# Patient Record
Sex: Male | Born: 1990 | Race: Black or African American | Hispanic: No | Marital: Single | Smoking: Never smoker
Health system: Southern US, Community
[De-identification: ages and names within clinical notes are randomized; demographics above are authoritative.]

## PROBLEM LIST (undated history)

## (undated) HISTORY — PX: TONSILLECTOMY: SUR1361

---

## 2013-09-19 ENCOUNTER — Emergency Department (HOSPITAL_COMMUNITY)
Admission: EM | Admit: 2013-09-19 | Discharge: 2013-09-19 | Disposition: A | Discharge status: Home / Self Care | Payer: Self-pay | Attending: Emergency Medicine | Admitting: Emergency Medicine

## 2013-09-19 ENCOUNTER — Emergency Department (HOSPITAL_COMMUNITY): Payer: Self-pay

## 2013-09-19 ENCOUNTER — Encounter (HOSPITAL_COMMUNITY): Payer: Self-pay | Admitting: Emergency Medicine

## 2013-09-19 DIAGNOSIS — W219XXA Striking against or struck by unspecified sports equipment, initial encounter: Secondary | ICD-10-CM | POA: Insufficient documentation

## 2013-09-19 DIAGNOSIS — Y9367 Activity, basketball: Secondary | ICD-10-CM | POA: Insufficient documentation

## 2013-09-19 DIAGNOSIS — S20211A Contusion of right front wall of thorax, initial encounter: Secondary | ICD-10-CM

## 2013-09-19 DIAGNOSIS — IMO0002 Reserved for concepts with insufficient information to code with codable children: Secondary | ICD-10-CM | POA: Insufficient documentation

## 2013-09-19 DIAGNOSIS — Y9239 Other specified sports and athletic area as the place of occurrence of the external cause: Secondary | ICD-10-CM | POA: Insufficient documentation

## 2013-09-19 DIAGNOSIS — S20219A Contusion of unspecified front wall of thorax, initial encounter: Secondary | ICD-10-CM | POA: Insufficient documentation

## 2013-09-19 NOTE — ED Provider Notes (Signed)
CSN: [id number]     Arrival date & time 09/19/13  1821 History  This chart was scribed for non-physician practitioner Marlon Pel, PA-C, working with Roney Marion, MD by Dorothey Baseman, ED Scribe. This Derek Hamilton was seen in room WTR5/WTR5 and the Derek Hamilton's care was started at 7:39 PM.    Chief Complaint  Derek Hamilton presents with  . Ribcage pain   . Back Pain   The history is provided by the Derek Hamilton. No language interpreter was used.   HPI Comments: Derek Hamilton is a 22 y.o. male who presents to the Emergency Department complaining of a constant, sharp pain to the right ribs and mid-back onset 2 days ago after Derek Hamilton reports that Derek Hamilton ran into another individual's shoulder while playing basketball. Derek Hamilton reports that the pain is exacerbated with long periods of standing, certain positions, and applying pressure. Derek Hamilton denies falling or hitting Derek Hamilton head. Derek Hamilton reports taking Naproxen at home with mild, temporary relief. Derek Hamilton denies shortness of breath. Derek Hamilton has no other pertinent medical history.   History reviewed. No pertinent past medical history. Past Surgical History  Procedure Laterality Date  . Tonsillectomy     History reviewed. No pertinent family history. History  Substance Use Topics  . Smoking status: Never Smoker   . Smokeless tobacco: Never Used  . Alcohol Use: Yes     Comment: occ    Review of Systems  Respiratory: Negative for shortness of breath.        Right rib pain  Musculoskeletal: Positive for back pain.  All other systems reviewed and are negative.    Allergies  Review of Derek Hamilton's allergies indicates not on file.  Home Medications  No current outpatient prescriptions on file.  Triage Vitals: BP 115/69  Pulse 65  Temp(Src) 98.1 F (36.7 C) (Oral)  Resp 17  SpO2 100%  Physical Exam  Nursing note and vitals reviewed. Constitutional: Derek Hamilton is oriented to person, place, and time. Derek Hamilton appears well-developed and well-nourished. No distress.  HENT:  Head:  Normocephalic and atraumatic.  Eyes: Conjunctivae are normal.  Neck: Normal range of motion. Neck supple.  Pulmonary/Chest: Effort normal. No respiratory distress.  Abdominal: Derek Hamilton exhibits no distension.  Musculoskeletal: Normal range of motion.  Posterior 6th and 7th ribs are tender to palpation. No ecchymosis or deformity.  Neurological: Derek Hamilton is alert and oriented to person, place, and time.  Skin: Skin is warm and dry.  Psychiatric: Derek Hamilton has a normal mood and affect. Derek Hamilton behavior is normal.    ED Course  Procedures (including critical care time)  DIAGNOSTIC STUDIES: Oxygen Saturation is 100% on room air, normal by my interpretation.    COORDINATION OF CARE: 7:41 PM- Discussed that x-ray results do not indicate any fractures and that symptoms are likely muscular in nature. Discussed treatment plan with Derek Hamilton at bedside and Derek Hamilton verbalized agreement.     Labs Review Labs Reviewed - No data to display  Imaging Review Dg Ribs Unilateral W/chest Right  09/19/2013   CLINICAL DATA:  Right rib pain post injury  EXAM: RIGHT RIBS AND CHEST - 3+ VIEW  COMPARISON:  None.  FINDINGS: No fracture or other bone lesions are seen involving the ribs. There is no evidence of pneumothorax or pleural effusion. Both lungs are clear. Heart size and mediastinal contours are within normal limits.  IMPRESSION: Negative.   Electronically Signed   By: Natasha Mead M.D.   On: 09/19/2013 18:59    EKG Interpretation   None  MDM   1. Rib contusion, right, initial encounter    Normal rib/chest xray. Pt has no difficulty breathing and a none acute physical exam. Will have him use ice and antiinflammatory pain medication. Pt is reassured by xrays   22 y.o.Donaldo Abadi's evaluation in the Emergency Department is complete. It has been determined that no acute conditions requiring further emergency intervention are present at this time. The Derek Hamilton/guardian have been advised of the diagnosis and plan.  We have discussed signs and symptoms that warrant return to the ED, such as changes or worsening in symptoms.  Vital signs are stable at discharge. Filed Vitals:   09/19/13 1841  BP: 115/69  Pulse: 65  Temp: 98.1 F (36.7 C)  Resp: 17    Derek Hamilton/guardian has voiced understanding and agreed to follow-up with the PCP or specialist.  I personally performed the services described in this documentation, which was scribed in my presence. The recorded information has been reviewed and is accurate.    Dorthula Matas, PA-C 09/19/13 1946

## 2013-09-19 NOTE — ED Notes (Signed)
Pt c/o R ribcage pain and mid back pain x 2 days. Pain score 8/10.  Pt sts "I was playing basketball, jumped, and landed on someone's shoulder."  Pt has been taking an old Naproxen prescription w/ some relief.

## 2013-09-19 NOTE — ED Notes (Signed)
Pt ambulatory to exam room with steady gait.  

## 2013-09-20 NOTE — ED Provider Notes (Signed)
Medical screening examination/treatment/procedure(s) were performed by non-physician practitioner and as supervising physician I was immediately available for consultation/collaboration.  EKG Interpretation   None         Alyse Kathan J Oriel Ojo, MD 09/20/13 0006 

## 2014-05-09 ENCOUNTER — Encounter (HOSPITAL_COMMUNITY): Payer: Self-pay | Admitting: Emergency Medicine

## 2014-05-09 ENCOUNTER — Emergency Department (HOSPITAL_COMMUNITY): Payer: Self-pay

## 2014-05-09 ENCOUNTER — Emergency Department (HOSPITAL_COMMUNITY)
Admission: EM | Admit: 2014-05-09 | Discharge: 2014-05-10 | Disposition: A | Discharge status: Home / Self Care | Payer: Self-pay | Attending: Emergency Medicine | Admitting: Emergency Medicine

## 2014-05-09 DIAGNOSIS — S99929A Unspecified injury of unspecified foot, initial encounter: Secondary | ICD-10-CM

## 2014-05-09 DIAGNOSIS — Y92838 Other recreation area as the place of occurrence of the external cause: Secondary | ICD-10-CM

## 2014-05-09 DIAGNOSIS — Y9239 Other specified sports and athletic area as the place of occurrence of the external cause: Secondary | ICD-10-CM | POA: Insufficient documentation

## 2014-05-09 DIAGNOSIS — Y9364 Activity, baseball: Secondary | ICD-10-CM | POA: Insufficient documentation

## 2014-05-09 DIAGNOSIS — X58XXXA Exposure to other specified factors, initial encounter: Secondary | ICD-10-CM | POA: Insufficient documentation

## 2014-05-09 DIAGNOSIS — S83004A Unspecified dislocation of right patella, initial encounter: Secondary | ICD-10-CM

## 2014-05-09 DIAGNOSIS — S8990XA Unspecified injury of unspecified lower leg, initial encounter: Secondary | ICD-10-CM | POA: Insufficient documentation

## 2014-05-09 DIAGNOSIS — S83006A Unspecified dislocation of unspecified patella, initial encounter: Secondary | ICD-10-CM | POA: Insufficient documentation

## 2014-05-09 DIAGNOSIS — S99919A Unspecified injury of unspecified ankle, initial encounter: Secondary | ICD-10-CM

## 2014-05-09 NOTE — ED Notes (Signed)
Pt was playing softball and had a collision with another player and his knee cap was dislocated from his knee

## 2014-05-09 NOTE — ED Provider Notes (Signed)
CSN: [id number]     Arrival date & time 05/09/14  2113 History  This chart was scribed for non-physician practitioner, Arthor CaptainAbigail Orren Pietsch, PA-C,working with Toy CookeyMegan Docherty, MD, by Karle PlumberJennifer Tensley, ED Scribe. This patient was seen in room WTR6/WTR6 and the patient's care was started at 11:52 PM.   Chief Complaint  Patient presents with  . Knee Pain   Patient is a 23 y.o. male presenting with knee pain. The history is provided by the patient. No language interpreter was used.  Knee Pain  HPI Comments:  Derek Hamilton is a 23 y.o. male who presents to the Emergency Department complaining of severe left knee pain secondary to colliding with someone while playing softball earlier today. Pt states his patella was moved medially and he pushed it back into position. He reports associated swelling to the medial aspect of the knee. Pt denies numbness, tingling or weakness of the left leg. He denies hip or ankle injury.  History reviewed. No pertinent past medical history. Past Surgical History  Procedure Laterality Date  . Tonsillectomy     History reviewed. No pertinent family history. History  Substance Use Topics  . Smoking status: Never Smoker   . Smokeless tobacco: Never Used  . Alcohol Use: Yes     Comment: occ    Review of Systems  Musculoskeletal: Positive for joint swelling.  Neurological: Negative for numbness.  All other systems reviewed and are negative.   Allergies  Review of patient's allergies indicates no known allergies.  Home Medications   Prior to Admission medications   Not on File   Triage Vitals: BP 115/67  Pulse 88  Temp(Src) 98.4 F (36.9 C) (Oral)  Resp 16  SpO2 100% Physical Exam  Nursing note and vitals reviewed. Constitutional: He is oriented to person, place, and time. He appears well-developed and well-nourished.  HENT:  Head: Normocephalic and atraumatic.  Eyes: EOM are normal.  Neck: Normal range of motion.  Cardiovascular: Normal rate.    Pulmonary/Chest: Effort normal.  Musculoskeletal: Normal range of motion.  Ecchymosis of right medial knee. Tender to palpation with swelling present. No deformities. ROM limited.  Neurological: He is alert and oriented to person, place, and time.  Neurovascularly intact.   Skin: Skin is warm and dry.  Psychiatric: He has a normal mood and affect. His behavior is normal.    ED Course  Procedures (including critical care time) DIAGNOSTIC STUDIES: Oxygen Saturation is 100% on RA, normal by my interpretation.   COORDINATION OF CARE: 11:56 PM- Will refer to orthopedics and provide knee sleeve. Pt verbalizes understanding and agrees to plan.  Medications - No data to display  Labs Review Labs Reviewed - No data to display  Imaging Review Dg Knee Complete 4 Views Right  05/09/2014   CLINICAL DATA:  Fall, knee pain  EXAM: RIGHT KNEE - COMPLETE 4+ VIEW  COMPARISON:  None.  FINDINGS: Small joint effusion. Negative for fracture. No significant joint space narrowing or spurring.  IMPRESSION: Small joint effusion.  Negative for fracture.   Electronically Signed   By: Marlan Palauharles  Clark M.D.   On: 05/09/2014 23:04     EKG Interpretation None      MDM   Final diagnoses:  Patellar dislocation, right, initial encounter    patient with apparent patellar dislocation and spontaneous reduction.  Patient X-Ray negative for obvious fracture or dislocation. Pain managed in ED. Pt advised to follow up with orthopedics if symptoms persist for possibility of missed fracture diagnosis. Patient given brace  while in ED, conservative therapy recommended and discussed. Patient will be dc home & is agreeable with above plan.   I personally reviewed the imaging tests through PACS system. I have reviewed and interpreted Lab values. I reviewed available ER/hospitalization records through the EMR    I personally performed the services described in this documentation, which was scribed in my presence. The  recorded information has been reviewed and is accurate.     Arthor Captain, PA-C 05/15/14 1946

## 2014-05-10 MED ORDER — HYDROCODONE-ACETAMINOPHEN 5-325 MG PO TABS: 1.0000 | ORAL_TABLET | ORAL | Status: AC | PRN

## 2014-05-10 MED ORDER — NAPROXEN 500 MG PO TABS: 500.0000 mg | ORAL_TABLET | Freq: Two times a day (BID) | ORAL | Status: AC

## 2014-05-10 NOTE — Discharge Instructions (Signed)
Patellar Dislocation A patellar dislocation occurs when your kneecap (patella) slips out of its normal position in a groove in front of the lower end of your thighbone (femur). This groove is called the patellofemoral groove.  CAUSES The kneecap is normally positioned over the front of the knee joint at the base of the thighbone. A kneecap can be dislocated when:  The kneecap is out of place (patellar tracking disorder), and force is applied.  The foot is firmly planted pointing outward, and the knee bends with the thigh turned inward. This kind of injury is common during many sports activities.  The inner edge of the kneecap is hit, pushing it toward the outer side of the leg. SIGNS AND SYMPTOMS  Severe pain.  A misshapen knee that looks like a bone is out of position.  A popping sensation, followed by a feeling that something is out of place.  Inability to bend or straighten the knee.  Knee swelling.  Cool, pale skin or numbness and tingling in or below the affected knee. DIAGNOSIS  Your health care provider will physically examine the injured area. An X-ray exam may be done to make sure a bone fracture has not occurred. In some cases, your health care provider may look inside your knee joint with an instrument much like a pencil-sized telescope (arthroscope). This may be done to make sure you have no loose cartilage in your joint. Loose cartilage is not visible on an X-ray image. TREATMENT  In many instances, the patella can be guided back into position without much difficulty. It often goes back into position by straightening the leg. Often, nothing more may be needed other than a brief period of immobilization followed by the exercises your health care provider recommends. If patellar dislocation starts to become frequent after the first incident, surgery may be needed to prevent your patella from slipping out of place. HOME CARE INSTRUCTIONS   Only take over-the-counter or  prescription medicines for pain, discomfort, or fever as directed by your health care provider.  Use a knee brace if directed to do so by your health care provider.  Use crutches as instructed.  Apply ice to the injured knee:  Put ice in a plastic bag.  Place a towel between your skin and the bag.  Leave the ice on for 20 minutes, 2-3 times a day.  Follow your health care provider's instructions for doing any recommended range-of-motion exercises or other exercises. SEEK IMMEDIATE MEDICAL CARE IF:  You have increased pain or swelling in the knee that is not relieved with medicine.  You have increasing inflammation in the knee.  You have locking or catching of your knee. MAKE SURE YOU:  Understand these instructions.  Will watch your condition.  Will get help right away if you are not doing well or get worse. Document Released: 06/15/2001 Document Revised: 07/11/2013 Document Reviewed: 05/02/2013 [facility] Patient Information 2015 [place], [place]. This information is not intended to replace advice given to you by your health care provider. Make sure you discuss any questions you have with your health care provider. RICE: Routine Care for Injuries The routine care of many injuries includes Rest, Ice, Compression, and Elevation (RICE). HOME CARE INSTRUCTIONS Rest is needed to allow your body to heal. Routine activities can usually be resumed when comfortable. Injured tendons and bones can take up to 6 weeks to heal. Tendons are the cord-like structures that attach muscle to bone. Ice following an injury helps keep the swelling down and reduces  pain. Put ice in a plastic bag. Place a towel between your skin and the bag. Leave the ice on for 15-20 minutes, 3-4 times a day, or as directed by your health care provider. Do this while awake, for the first 24 to 48 hours. After that, continue as directed by your caregiver. Compression helps keep swelling down. It also gives support and  helps with discomfort. If an elastic bandage has been applied, it should be removed and reapplied every 3 to 4 hours. It should not be applied tightly, but firmly enough to keep swelling down. Watch fingers or toes for swelling, bluish discoloration, coldness, numbness, or excessive pain. If any of these problems occur, remove the bandage and reapply loosely. Contact your caregiver if these problems continue. Elevation helps reduce swelling and decreases pain. With extremities, such as the arms, hands, legs, and feet, the injured area should be placed near or above the level of the heart, if possible. SEEK IMMEDIATE MEDICAL CARE IF: You have persistent pain and swelling. You develop redness, numbness, or unexpected weakness. Your symptoms are getting worse rather than improving after several days. These symptoms may indicate that further evaluation or further X-rays are needed. Sometimes, X-rays may not show a small broken bone (fracture) until 1 week or 10 days later. Make a follow-up appointment with your caregiver. Ask when your X-ray results will be ready. Make sure you get your X-ray results. Document Released: 01/02/2001 Document Revised: 09/25/2013 Document Reviewed: 02/19/2011 [facility] Patient Information 2015 [place]. This information is not intended to replace advice given to you by your health care provider. Make sure you discuss any questions you have with your health care provider.

## 2014-05-10 NOTE — ED Notes (Signed)
Pt states he has crutches at home, states he does not want the ones from here.

## 2014-05-16 NOTE — ED Provider Notes (Signed)
Medical screening examination/treatment/procedure(s) were performed by non-physician practitioner and as supervising physician I was immediately available for consultation/collaboration.   Megan Docherty, MD 05/16/14 0007 

## 2015-10-26 IMAGING — CR DG KNEE COMPLETE 4+V*R*
4 series · 4 of 4 positions shown · non-contrast
Comparison: None.

CLINICAL DATA: Fall, knee pain

EXAM:
RIGHT KNEE - COMPLETE 4+ VIEW

[t knee ap right]
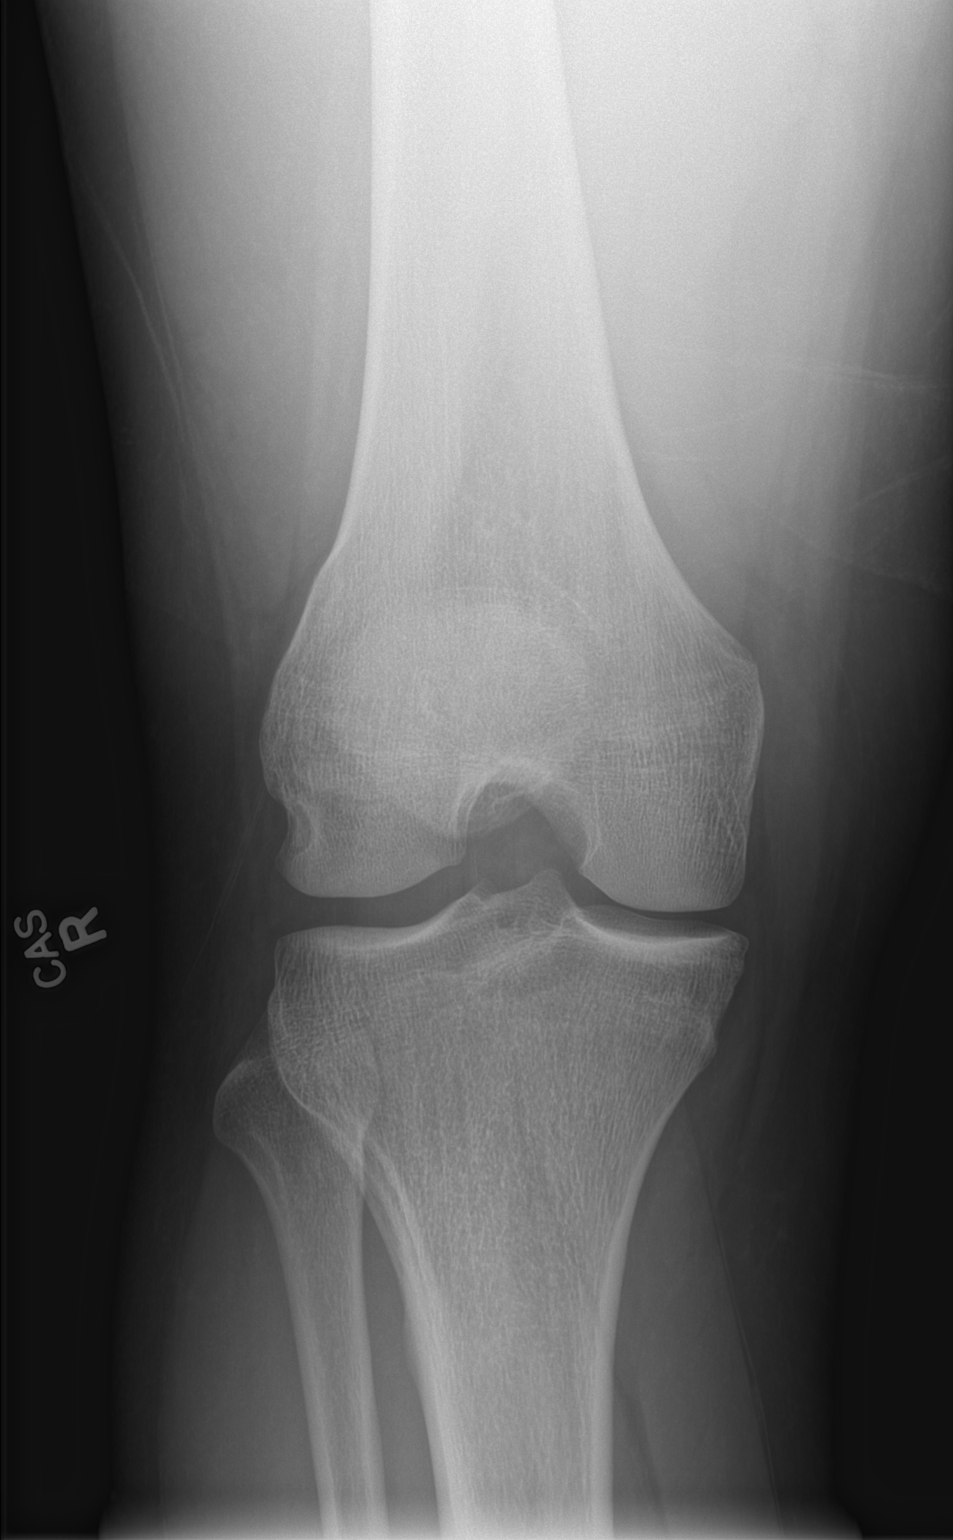

[t knee obl right (1 of 2)]
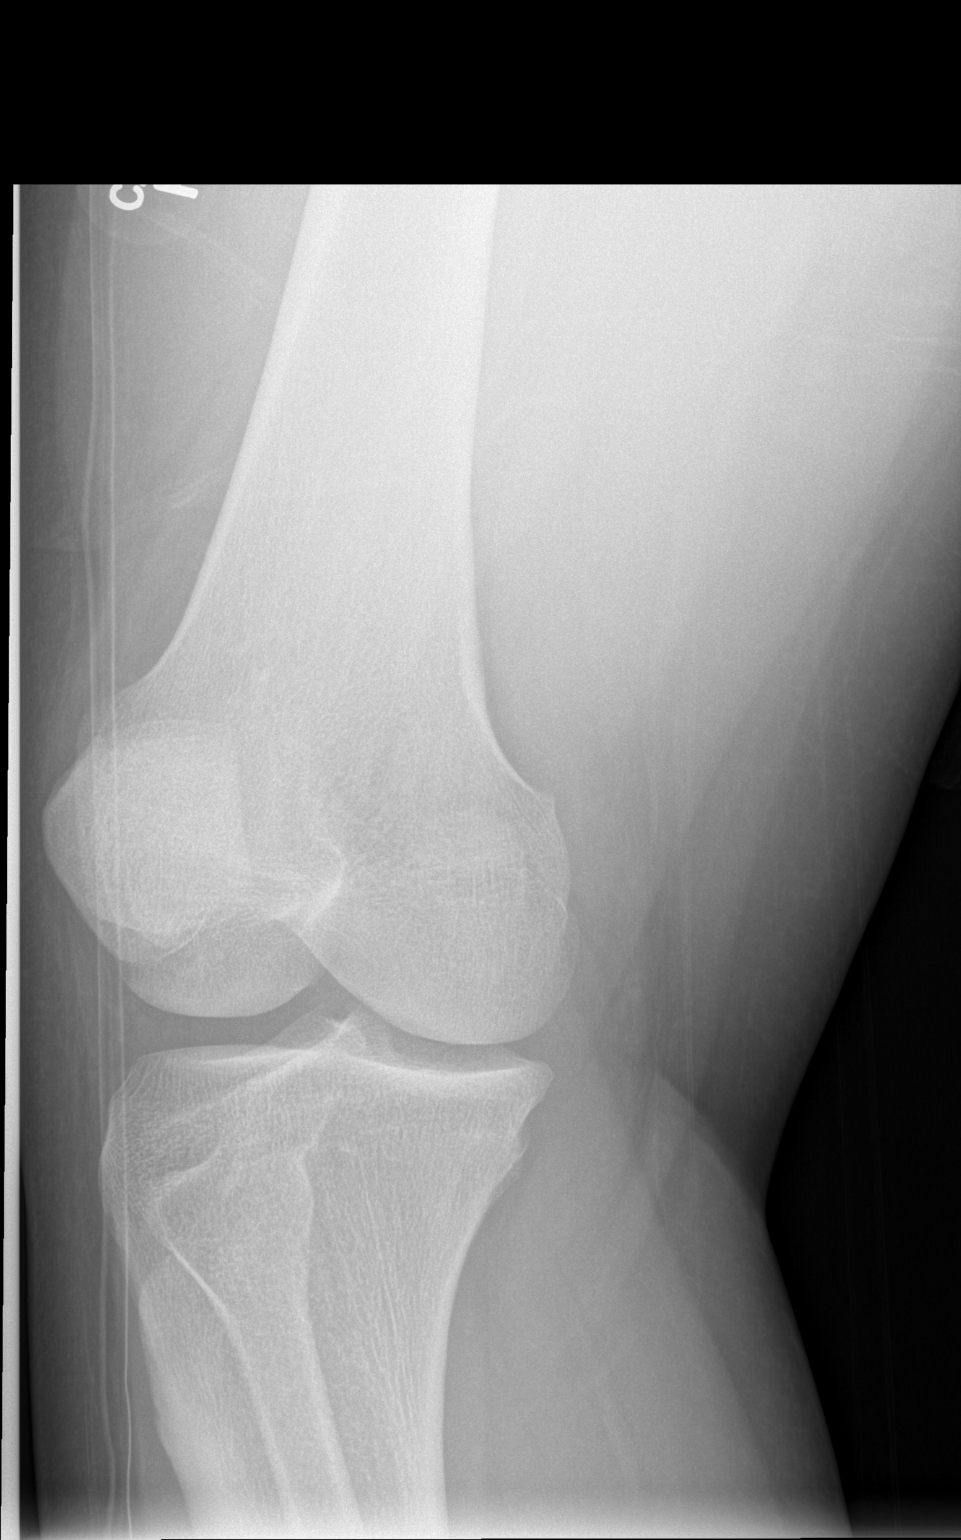

[t knee obl right (2 of 2)]
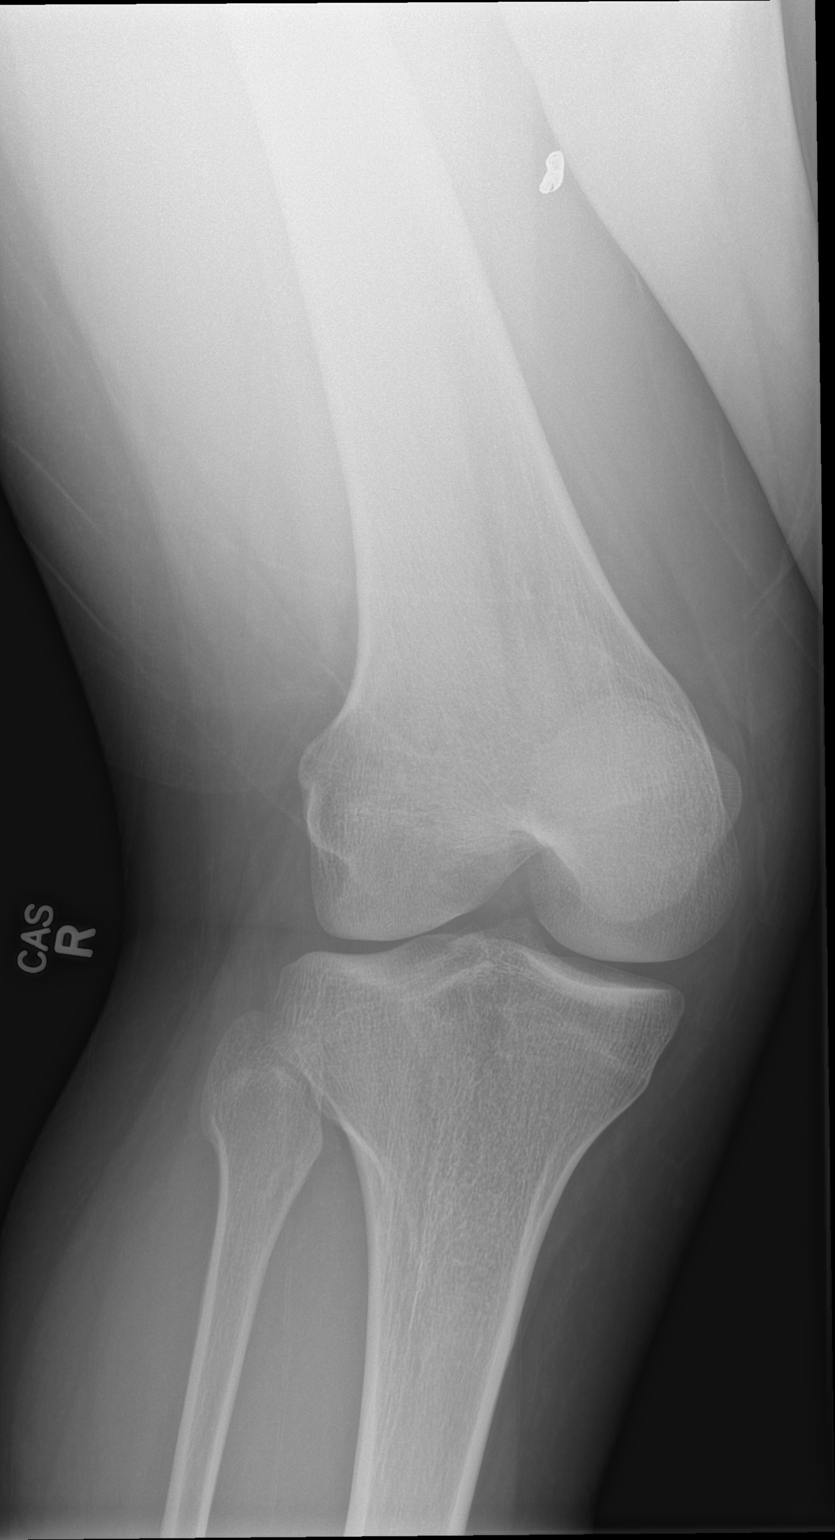

[t knee lat right]
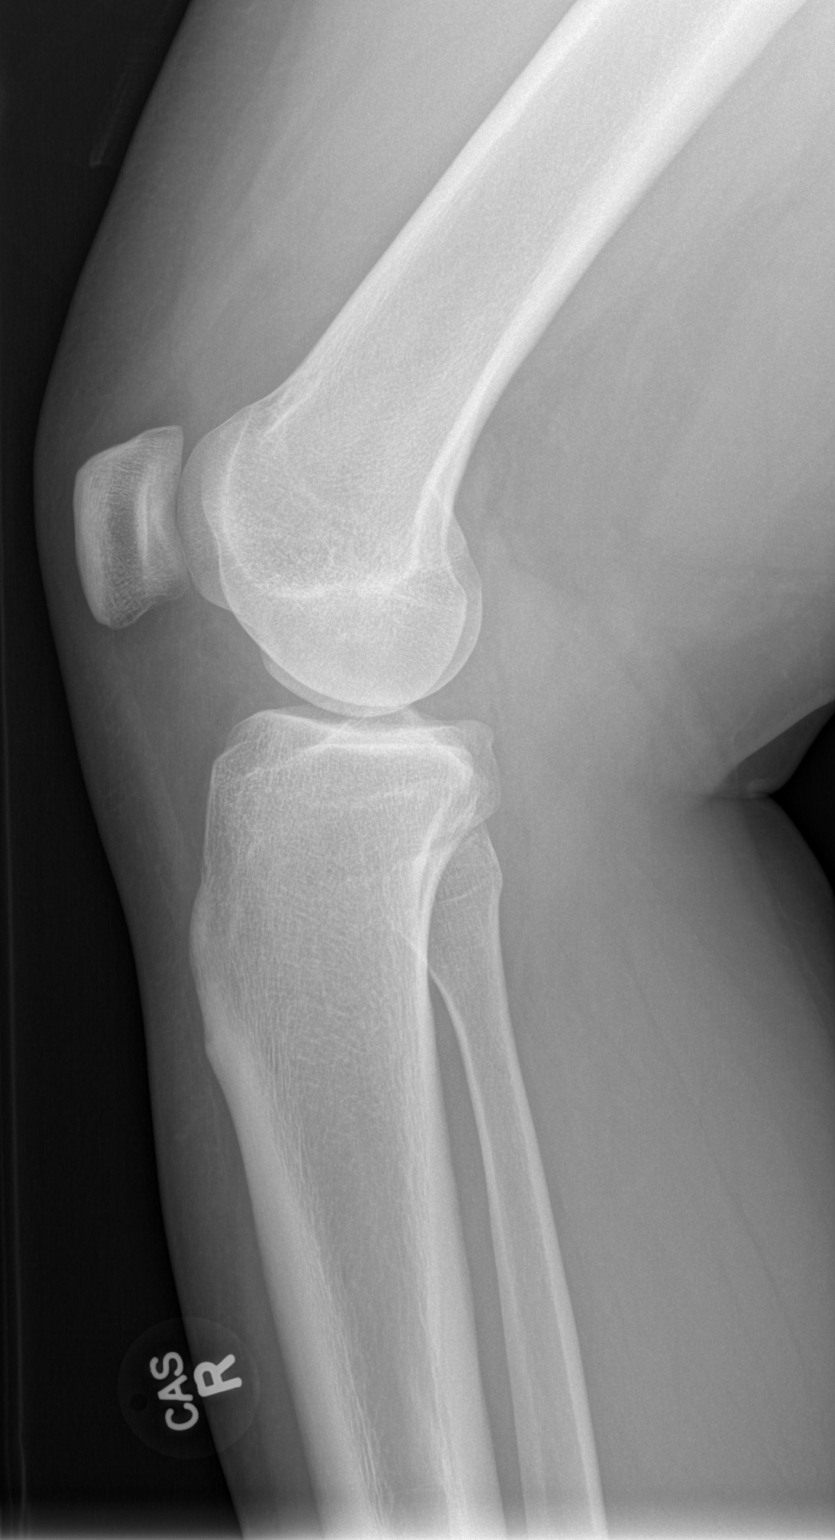

[4 of 4 positions shown; findings below may reference images not displayed]

FINDINGS: Small joint effusion. Negative for fracture. No significant joint
space narrowing or spurring.
IMPRESSION: Small joint effusion.  Negative for fracture.
